# Patient Record
Sex: Male | Born: 1970 | Race: White | Hispanic: No | Marital: Married | Smoking: Former smoker
Health system: Southern US, Community
[De-identification: ages and names within clinical notes are randomized; demographics above are authoritative.]

---

## 2007-03-16 ENCOUNTER — Ambulatory Visit: Payer: Self-pay | Admitting: Family Medicine

## 2007-08-13 ENCOUNTER — Encounter: Payer: Self-pay | Admitting: Sports Medicine

## 2007-08-31 ENCOUNTER — Encounter: Payer: Self-pay | Admitting: Sports Medicine

## 2019-02-27 ENCOUNTER — Ambulatory Visit: Payer: Self-pay

## 2019-02-27 ENCOUNTER — Other Ambulatory Visit: Payer: Self-pay

## 2019-02-27 DIAGNOSIS — Z23 Encounter for immunization: Secondary | ICD-10-CM

## 2019-04-09 ENCOUNTER — Other Ambulatory Visit: Payer: Self-pay

## 2019-04-09 DIAGNOSIS — Z20822 Contact with and (suspected) exposure to covid-19: Secondary | ICD-10-CM

## 2019-04-10 LAB — NOVEL CORONAVIRUS, NAA: SARS-CoV-2, NAA: DETECTED — AB

## 2020-02-23 ENCOUNTER — Emergency Department: Payer: BC Managed Care – PPO

## 2020-02-23 ENCOUNTER — Emergency Department
Admission: EM | Admit: 2020-02-23 | Discharge: 2020-02-23 | Disposition: A | Discharge status: Home / Self Care | Payer: BC Managed Care – PPO | Attending: Emergency Medicine | Admitting: Emergency Medicine

## 2020-02-23 ENCOUNTER — Other Ambulatory Visit: Payer: Self-pay

## 2020-02-23 ENCOUNTER — Encounter: Payer: Self-pay | Admitting: Emergency Medicine

## 2020-02-23 DIAGNOSIS — R079 Chest pain, unspecified: Secondary | ICD-10-CM | POA: Diagnosis not present

## 2020-02-23 DIAGNOSIS — R0789 Other chest pain: Secondary | ICD-10-CM

## 2020-02-23 LAB — CBC
HCT: 46 % (ref 39.0–52.0)
Hemoglobin: 15.7 g/dL (ref 13.0–17.0)
MCH: 32.4 pg (ref 26.0–34.0)
MCHC: 34.1 g/dL (ref 30.0–36.0)
MCV: 95 fL (ref 80.0–100.0)
Platelets: 233 10*3/uL (ref 150–400)
RBC: 4.84 MIL/uL (ref 4.22–5.81)
RDW: 12.6 % (ref 11.5–15.5)
WBC: 6.8 10*3/uL (ref 4.0–10.5)
nRBC: 0 % (ref 0.0–0.2)

## 2020-02-23 LAB — TROPONIN I (HIGH SENSITIVITY)
Troponin I (High Sensitivity): 6 ng/L (ref <18)
Troponin I (High Sensitivity): 10 ng/L (ref <18)

## 2020-02-23 LAB — BASIC METABOLIC PANEL
Anion gap: 9 (ref 5–15)
BUN: 13 mg/dL (ref 6–20)
CO2: 29 mmol/L (ref 22–32)
Calcium: 9.4 mg/dL (ref 8.9–10.3)
Chloride: 103 mmol/L (ref 98–111)
Creatinine, Ser: 0.96 mg/dL (ref 0.61–1.24)
GFR, Estimated: >60 mL/min (ref >60)
Glucose, Bld: 113 mg/dL — ABNORMAL HIGH (ref 70–99)
Potassium: 4 mmol/L (ref 3.5–5.1)
Sodium: 141 mmol/L (ref 135–145)

## 2020-02-23 MED ORDER — ASPIRIN 81 MG PO CHEW
324.0000 mg | CHEWABLE_TABLET | Freq: Once | ORAL | Status: AC
  Administered 2020-02-23: 324 mg via ORAL
  Filled 2020-02-23: qty 4

## 2020-02-23 MED ORDER — IOHEXOL 350 MG/ML SOLN
100.0000 mL | Freq: Once | INTRAVENOUS | Status: AC | PRN
  Administered 2020-02-23: 100 mL via INTRAVENOUS

## 2020-02-23 NOTE — ED Notes (Signed)
Patient transported to CT 

## 2020-02-23 NOTE — Discharge Instructions (Addendum)
Please avoid exertion as we discussed, return to the ED if any change or worsening

## 2020-02-23 NOTE — ED Triage Notes (Signed)
Pt reports this am around 7:30 awoke with pressure like pain to his left shoulder hat radiates down into his chest. Pt denies a cardiac hx but reports over the last week has had similar pain with running. Pt reports some SOB as well.

## 2020-02-23 NOTE — ED Provider Notes (Signed)
[facility] Emergency Department Provider Note   ____________________________________________    I have reviewed the triage vital signs and the nursing notes.   HISTORY  Chief Complaint Chest pain   HPI Francisco Schwartz is a 49 y.o. male who presents with complaints of chest pain. Patient describes chest discomfort in his left anterior superior chest with some radiation to his shoulder on the left as well. He reports this occurred 1 week ago when he first started on a jog but improved after the first half mile. Patient is an avid runner. No cough or fevers. Has had COVID-19 vaccinations. Is a very strong family history of heart disease, his father had an angioplasty at age 21 and his grandfather had a heart attack in his 29s as well  History reviewed. No pertinent past medical history.  There are no problems to display for this patient.   History reviewed. No pertinent surgical history.  Prior to Admission medications   Not on File     Allergies Patient has no allergy information on record.  No family history on file.  Social History No drugs, no smoking  Review of Systems  Constitutional: No fever/chills Eyes: No visual changes.  ENT: No sore throat. Cardiovascular: As above Respiratory: Denies shortness of breath. Gastrointestinal: No abdominal pain.   Genitourinary: Negative for dysuria. Musculoskeletal: Negative for back pain. Skin: Negative for rash. Neurological: Negative for headaches    ____________________________________________   PHYSICAL EXAM:  VITAL SIGNS: ED Triage Vitals  Enc Vitals Group     BP 02/23/20 0935 133/77     Pulse Rate 02/23/20 0935 63     Resp 02/23/20 0935 17     Temp 02/23/20 0935 98 F (36.7 C)     Temp Source 02/23/20 0935 Oral     SpO2 02/23/20 0935 100 %     Weight 02/23/20 0925 72.6 kg (160 lb)     Height 02/23/20 0925 1.727 m (5\' 8" )     Head Circumference --      Peak Flow --      Pain  Score 02/23/20 0924 7     Pain Loc --      Pain Edu? --      Excl. in GC? --     Constitutional: Alert and oriented. No acute distress. Pleasant and interactive  Nose: No congestion/rhinnorhea. Mouth/Throat: Mucous membranes are moist.   Neck:  Painless ROM Cardiovascular: Normal rate, regular rhythm. Grossly normal heart sounds.  Good peripheral circulation. Respiratory: Normal respiratory effort.  No retractions. Lungs CTAB.  Genitourinary: deferred Musculoskeletal: No lower extremity tenderness nor edema.  Warm and well perfused Neurologic:  Normal speech and language. No gross focal neurologic deficits are appreciated.  Skin:  Skin is warm, dry and intact. No rash noted. Psychiatric: Mood and affect are normal. Speech and behavior are normal.  ____________________________________________   LABS (all labs ordered are listed, but only abnormal results are displayed)  Labs Reviewed  BASIC METABOLIC PANEL - Abnormal; Notable for the following components:      Result Value   Glucose, Bld 113 (*)    All other components within normal limits  CBC  TROPONIN I (HIGH SENSITIVITY)  TROPONIN I (HIGH SENSITIVITY)   ____________________________________________  EKG  ED ECG REPORT I, 02/25/20, the attending physician, personally viewed and interpreted this ECG.  Date: 02/23/2020  Rhythm: normal sinus rhythm QRS Axis: normal Intervals: Normal ST/T Wave abnormalities: normal Narrative Interpretation: no evidence of acute  ischemia  ____________________________________________  RADIOLOGY  Chest x-ray reviewed by me, no infiltrate or pneumothorax CT angiography ____________________________________________   PROCEDURES  Procedure(s) performed: No  Procedures   Critical Care performed: No ____________________________________________   INITIAL IMPRESSION / ASSESSMENT AND PLAN / ED COURSE  Pertinent labs & imaging results that were available during my care of  the patient were reviewed by me and considered in my medical decision making (see chart for details).  Patient presents with pain in the anterior chest rating to the left shoulder, does seem to have an exertional component. Today he woke up with it this morning, this is quite atypical for him.   Differential includes angina/ACS, PE, dissection, musculoskeletal pain.  No calf pain or swelling, no pleurisy to suggest PE.  EKG is overall reassuring, initial troponin is normal, will do delta troponin. Will obtain CT angiography to evaluate for dissection given radiation to the left shoulder.  CT angiography reviewed by me, no abnormalities noted, radiology has confirmed  Delta troponin is reassuring, heart score of 2, patient is low risk, asymptomatic at this time, appropriate for discharge.  Counseled no exercise physical exertion until cleared by cardiology, close follow-up needed.  Dr. Gwen Pounds reports his office will call the patient tomorrow for close follow-up    ____________________________________________   FINAL CLINICAL IMPRESSION(S) / ED DIAGNOSES  Final diagnoses:  Atypical chest pain        Note:  This document was prepared using Dragon voice recognition software and may include unintentional dictation errors.   Jene Every, MD 02/23/20 1248

## 2020-05-15 ENCOUNTER — Other Ambulatory Visit (HOSPITAL_COMMUNITY): Payer: Self-pay | Admitting: Rehabilitative and Restorative Service Providers"

## 2020-05-15 ENCOUNTER — Other Ambulatory Visit: Payer: Self-pay | Admitting: Cardiology

## 2020-05-15 DIAGNOSIS — Z8249 Family history of ischemic heart disease and other diseases of the circulatory system: Secondary | ICD-10-CM

## 2020-05-15 DIAGNOSIS — R0789 Other chest pain: Secondary | ICD-10-CM

## 2020-05-15 DIAGNOSIS — R9439 Abnormal result of other cardiovascular function study: Secondary | ICD-10-CM

## 2020-05-19 ENCOUNTER — Telehealth (HOSPITAL_COMMUNITY): Payer: Self-pay | Admitting: *Deleted

## 2020-05-19 NOTE — Telephone Encounter (Signed)
Reaching out to patient to offer assistance regarding upcoming cardiac imaging study; pt verbalizes understanding of appt date/time, parking situation and where to check in, pre-test NPO status, and verified current allergies; name and call back number provided for further questions should they arise  Larey Brick RN Navigator Cardiac Imaging [facility] [phone] office [phone] cell  Pt reports resting HR is around 60bpm or lower.

## 2020-05-21 ENCOUNTER — Other Ambulatory Visit: Payer: Self-pay

## 2020-05-21 ENCOUNTER — Ambulatory Visit
Admission: RE | Admit: 2020-05-21 | Discharge: 2020-05-21 | Disposition: A | Discharge status: Home / Self Care | Payer: BC Managed Care – PPO | Source: Ambulatory Visit | Attending: Rehabilitative and Restorative Service Providers" | Admitting: Rehabilitative and Restorative Service Providers"

## 2020-05-21 DIAGNOSIS — R0789 Other chest pain: Secondary | ICD-10-CM

## 2020-05-21 DIAGNOSIS — Z8249 Family history of ischemic heart disease and other diseases of the circulatory system: Secondary | ICD-10-CM | POA: Diagnosis not present

## 2020-05-21 DIAGNOSIS — R9439 Abnormal result of other cardiovascular function study: Secondary | ICD-10-CM | POA: Insufficient documentation

## 2020-05-21 MED ORDER — NITROGLYCERIN 0.4 MG SL SUBL
0.8000 mg | SUBLINGUAL_TABLET | Freq: Once | SUBLINGUAL | Status: AC
  Administered 2020-05-21: 0.8 mg via SUBLINGUAL

## 2020-05-21 MED ORDER — METOPROLOL TARTRATE 5 MG/5ML IV SOLN
10.0000 mg | Freq: Once | INTRAVENOUS | Status: AC
  Administered 2020-05-21: 10 mg via INTRAVENOUS

## 2020-05-21 MED ORDER — IOHEXOL 350 MG/ML SOLN
75.0000 mL | Freq: Once | INTRAVENOUS | Status: AC | PRN
  Administered 2020-05-21: 75 mL via INTRAVENOUS

## 2020-05-21 NOTE — Progress Notes (Signed)
Patient tolerated procedure well. Ambulate w/o difficulty. Sitting in chair drinking water provided. Encouraged to drink extra water today and reasoning explained. Verbalized understanding. All questions answered. ABC intact. No further needs. Discharge from procedure area w/o issues.  

## 2022-09-12 IMAGING — CT CT HEART MORP W/ CTA COR W/ SCORE W/ CA W/CM &/OR W/O CM
2 of 11 series · 7 of 20 positions shown, 9 images · non-contrast
Comparison: None.

Addendum:
CLINICAL DATA: Chest pain

EXAM:
Cardiac/Coronary  CTA
TECHNIQUE: The patient was scanned on a Siemens Somatoform go.Top scanner.

[Series 26: multiphase % cta coronary 0.60 · axial · 0.34mm/px · z∈[-1122,-1079]mm · 2 of 2261 slices shown]
[im 754/2261  vessel]
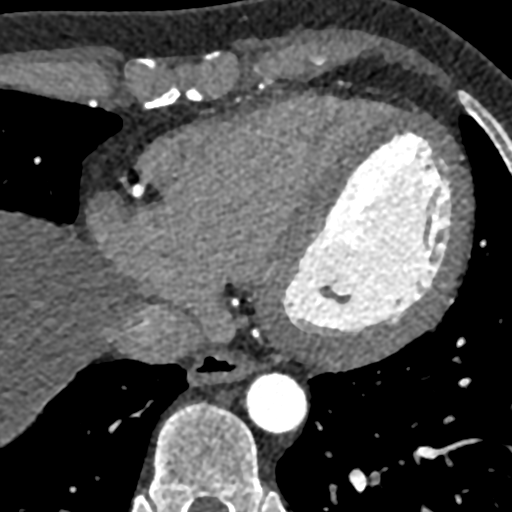
[im 1507/2261  vessel]
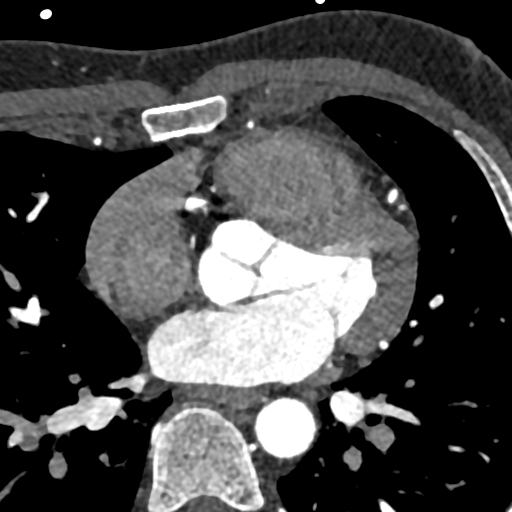

[Series 44: ms multiphase cta coronary 0.60 · axial · 0.34mm/px · z∈[-1143,-1057]mm · 5 of 2907 slices shown, 7 images]
[im 485/2907  vessel]
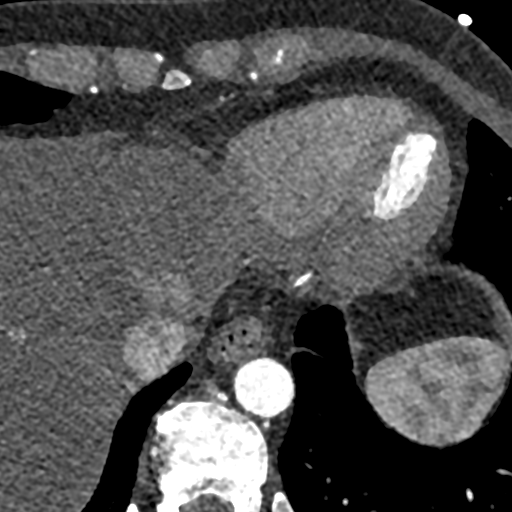
[im 485/2907  lung]
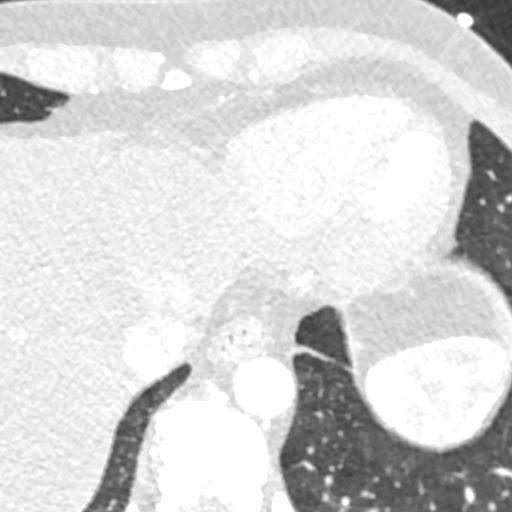
[im 969/2907  vessel]
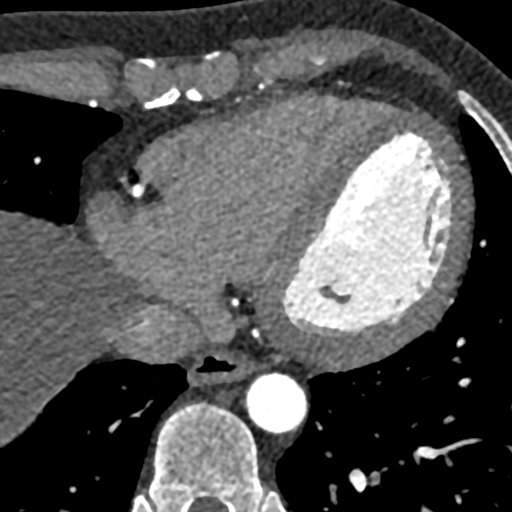
[im 1454/2907  vessel]
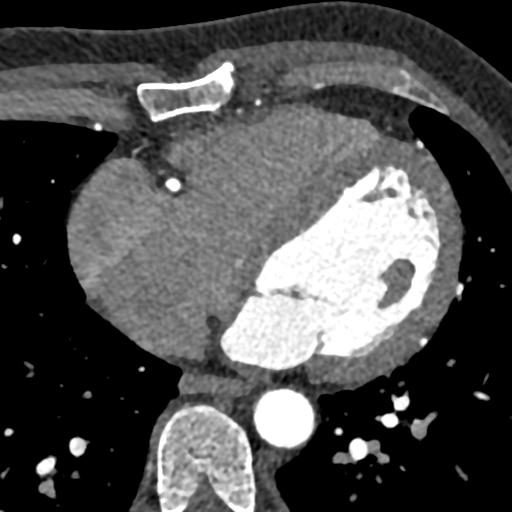
[im 1938/2907  vessel]
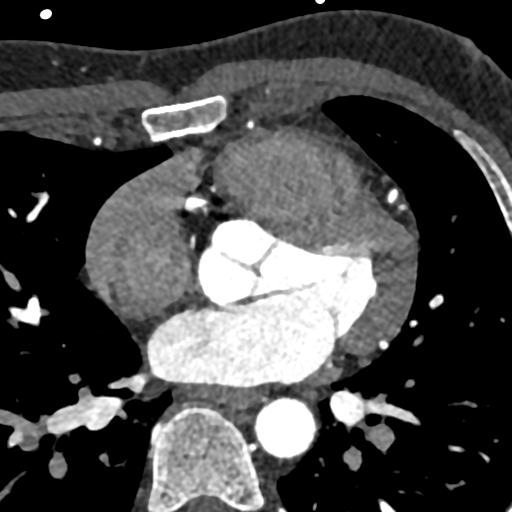
[im 2422/2907  vessel]
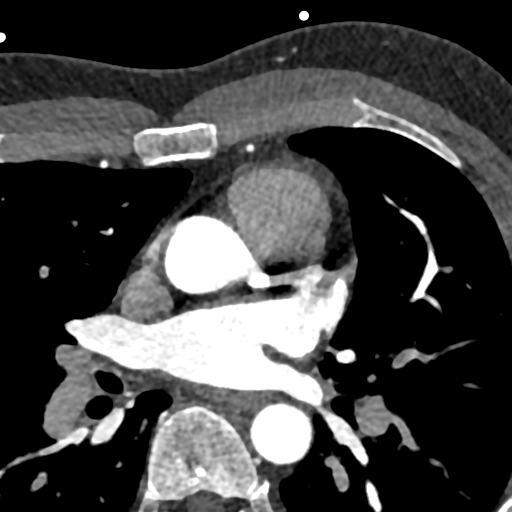
[im 2422/2907  lung]
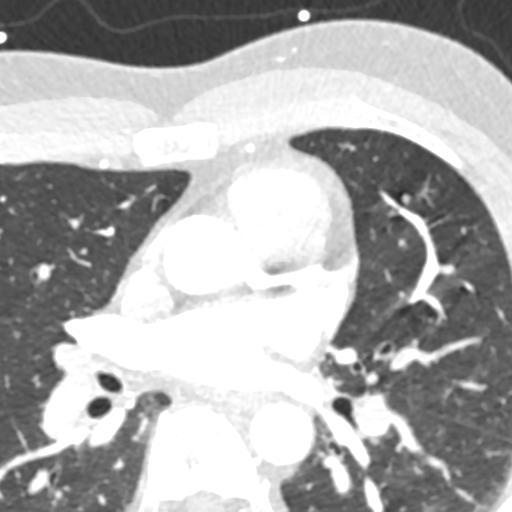

[7 of 20 positions shown; findings below may reference images not displayed]

FINDINGS: A retrospective scan was triggered in the descending thoracic aorta.
Axial non-contrast 3 mm slices were carried out through the heart.
The data set was analyzed on a dedicated work station and scored
using the Agatson method. Gantry rotation speed was 330 msecs and
collimation was .6 mm. 100mg of metoprolol and 0.8 mg of sl NTG was
given. The 3D data set was reconstructed in 5% intervals of the
60-95 % of the R-R cycle. Diastolic phases were analyzed on a
dedicated work station using MPR, MIP and VRT modes. The patient
received 75 cc of contrast.

Aorta:  Normal size.  No calcifications.  No dissection.

Aortic Valve:  Trileaflet.  No calcifications.

Coronary Arteries:  Normal coronary origin.  Right dominance.

RCA is a large dominant artery that gives rise to PDA and PLA. There
is no plaque.

Left main is a large artery that gives rise to LAD and LCX arteries.

LAD has no plaque.

LCX is a non-dominant artery that gives rise to one large OM1
branch. There is no plaque.

Other findings:

Normal pulmonary vein drainage into the left atrium.

Normal left atrial appendage without a thrombus.

Normal size of the pulmonary artery.
IMPRESSION: 1. Coronary calcium score of 0. Patient is low risk for coronary
events

2. Normal coronary origin with right dominance.

3. No evidence of CAD.

4. CAD-RADS 0. Consider non-atherosclerotic causes of chest pain.

EXAM:
OVER-READ INTERPRETATION  CT CHEST

The following report is an over-read performed by radiologist Dr.
over-read does not include interpretation of cardiac or coronary
anatomy or pathology. The coronary calcium score and cardiac CTA
interpretation by the cardiologist is attached.
FINDINGS: Within the visualized portions of the thorax there are no suspicious
appearing pulmonary nodules or masses, there is no acute
consolidative airspace disease, no pleural effusions, no
pneumothorax and no lymphadenopathy. Visualized portions of the
upper abdomen are unremarkable. There are no aggressive appearing
lytic or blastic lesions noted in the visualized portions of the
skeleton.
IMPRESSION: No significant incidental noncardiac findings are noted.

*** End of Addendum ***
FINDINGS: A retrospective scan was triggered in the descending thoracic aorta.
Axial non-contrast 3 mm slices were carried out through the heart.
The data set was analyzed on a dedicated work station and scored
using the Agatson method. Gantry rotation speed was 330 msecs and
collimation was .6 mm. 100mg of metoprolol and 0.8 mg of sl NTG was
given. The 3D data set was reconstructed in 5% intervals of the
60-95 % of the R-R cycle. Diastolic phases were analyzed on a
dedicated work station using MPR, MIP and VRT modes. The patient
received 75 cc of contrast.

Aorta:  Normal size.  No calcifications.  No dissection.

Aortic Valve:  Trileaflet.  No calcifications.

Coronary Arteries:  Normal coronary origin.  Right dominance.

RCA is a large dominant artery that gives rise to PDA and PLA. There
is no plaque.

Left main is a large artery that gives rise to LAD and LCX arteries.

LAD has no plaque.

LCX is a non-dominant artery that gives rise to one large OM1
branch. There is no plaque.

Other findings:

Normal pulmonary vein drainage into the left atrium.

Normal left atrial appendage without a thrombus.

Normal size of the pulmonary artery.
IMPRESSION: 1. Coronary calcium score of 0. Patient is low risk for coronary
events

2. Normal coronary origin with right dominance.

3. No evidence of CAD.

4. CAD-RADS 0. Consider non-atherosclerotic causes of chest pain.

## 2022-11-29 ENCOUNTER — Emergency Department
Admission: EM | Admit: 2022-11-29 | Discharge: 2022-11-29 | Disposition: A | Discharge status: Home / Self Care | Payer: BC Managed Care – PPO | Attending: Emergency Medicine | Admitting: Emergency Medicine

## 2022-11-29 ENCOUNTER — Other Ambulatory Visit: Payer: Self-pay

## 2022-11-29 ENCOUNTER — Encounter: Payer: Self-pay | Admitting: Emergency Medicine

## 2022-11-29 DIAGNOSIS — S81852A Open bite, left lower leg, initial encounter: Secondary | ICD-10-CM | POA: Insufficient documentation

## 2022-11-29 DIAGNOSIS — Y9302 Activity, running: Secondary | ICD-10-CM | POA: Diagnosis not present

## 2022-11-29 DIAGNOSIS — W540XXA Bitten by dog, initial encounter: Secondary | ICD-10-CM | POA: Diagnosis not present

## 2022-11-29 DIAGNOSIS — Z23 Encounter for immunization: Secondary | ICD-10-CM | POA: Diagnosis not present

## 2022-11-29 MED ORDER — TETANUS-DIPHTH-ACELL PERTUSSIS 5-2.5-18.5 LF-MCG/0.5 IM SUSY
0.5000 mL | PREFILLED_SYRINGE | Freq: Once | INTRAMUSCULAR | Status: AC
  Administered 2022-11-29: 0.5 mL via INTRAMUSCULAR
  Filled 2022-11-29: qty 0.5

## 2022-11-29 MED ORDER — RABIES VACCINE, PCEC IM SUSR: 1.0000 mL | Freq: Once | INTRAMUSCULAR | Status: DC

## 2022-11-29 MED ORDER — RABIES IMMUNE GLOBULIN 150 UNIT/ML IM INJ
20.0000 [IU]/kg | INJECTION | Freq: Once | INTRAMUSCULAR | Status: DC
  Filled 2022-11-29: qty 10

## 2022-11-29 MED ORDER — AMOXICILLIN-POT CLAVULANATE 875-125 MG PO TABS: 1.0000 | ORAL_TABLET | Freq: Two times a day (BID) | ORAL | 0 refills | Status: AC

## 2022-11-29 MED ORDER — LIDOCAINE HCL (PF) 1 % IJ SOLN
5.0000 mL | Freq: Once | INTRAMUSCULAR | Status: AC
  Administered 2022-11-29: 5 mL

## 2022-11-29 MED ORDER — IBUPROFEN 600 MG PO TABS
600.0000 mg | ORAL_TABLET | Freq: Once | ORAL | Status: AC
  Administered 2022-11-29: 600 mg via ORAL
  Filled 2022-11-29: qty 1

## 2022-11-29 NOTE — Discharge Instructions (Addendum)
You were seen in the emergency department for a dog bite.  Animal control was notified.  Your wound was repaired, follow-up in 7 days to have your sutures removed.  Keep the area dry and clean.  You can apply triple antibiotic ointment to the area twice a day.  Start your antibiotics as prescribed.  Return to the emergency department for any signs of an infection.  Your tetanus was updated today.  Since the dog has been restrained and can be observed, can observe for 10 days and if abnormal behavior, complete rabies series.  You can stay in touch with animal control.  Pain control:  Ibuprofen (motrin/aleve/advil) - You can take 3 tablets (600 mg) every 6 hours as needed for pain/fever.  Acetaminophen (tylenol) - You can take 2 extra strength tablets (1000 mg) every 6 hours as needed for pain/fever.  You can alternate these medications or take them together.  Make sure you eat food/drink water when taking these medications.

## 2022-11-29 NOTE — ED Triage Notes (Signed)
Pt bit by dog today while running. Unsure if dog is vaccinated. Wound to left calf.

## 2022-11-29 NOTE — ED Provider Notes (Signed)
[facility] Provider Note    Event Date/Time   First MD Initiated Contact with Patient 11/29/22 1333     (approximate)   History   Animal Bite   HPI  Francisco Schwartz is a 52 y.o. male no significant past medical history presents to the emergency department for dog bite.  Patient was on a run around [place] when he was attacked by dog.  States that the neighbors were there who owned the dog.  States that they have the dog and is being contained.  They stated that the rabies vaccinations are up-to-date however he is uncertain because there is multiple dogs there.  Uncertain of last tetanus shot.  Took Tylenol prior to arrival.     Physical Exam   Triage Vital Signs: ED Triage Vitals  Encounter Vitals Group     BP 11/29/22 1333 (!) 138/95     Systolic BP Percentile --      Diastolic BP Percentile --      Pulse Rate 11/29/22 1333 (!) 106     Resp 11/29/22 1333 16     Temp 11/29/22 1333 98 F (36.7 C)     Temp Source 11/29/22 1333 Oral     SpO2 11/29/22 1333 100 %     Weight 11/29/22 1332 160 lb (72.6 kg)     Height 11/29/22 1332 5\' 8"  (1.727 m)     Head Circumference --      Peak Flow --      Pain Score 11/29/22 1333 6     Pain Loc --      Pain Education --      Exclude from Growth Chart --     Most recent vital signs: Vitals:   11/29/22 1333  BP: (!) 138/95  Pulse: (!) 106  Resp: 16  Temp: 98 F (36.7 C)  SpO2: 100%    Physical Exam Constitutional:      Appearance: He is well-developed.  HENT:     Head: Atraumatic.  Eyes:     Conjunctiva/sclera: Conjunctivae normal.  Cardiovascular:     Rate and Rhythm: Regular rhythm.  Pulmonary:     Effort: No respiratory distress.  Musculoskeletal:     Cervical back: Normal range of motion.     Comments: Laceration/dog bite to the left lower leg, hemostatic  Skin:    General: Skin is warm.  Neurological:     Mental Status: He is alert. Mental status is at baseline.      IMPRESSION / MDM  / ASSESSMENT AND PLAN / ED COURSE  I reviewed the triage vital signs and the nursing notes.  Animal control was notified.  Differential diagnosis including rabies prophylaxis, dog bite, laceration care   Labs (all labs ordered are listed, but only abnormal results are displayed) Labs interpreted as -    Labs Reviewed - No data to display    Tetanus updated.  Given Motrin for team pain control.  After shared decision making opted to close the patient's wound.  Will start the patient on antibiotics.  Discussed Motrin and Tylenol for pain control.  Animal control able to note confirmed that the dog's vaccinations for rabies were up-to-date.  They will quarantine the dog for 10 days.  Patient stated that he will follow-up and return if any concern with animal control to complete rabies vaccinations.   PROCEDURES:  Critical Care performed: No  ..Laceration Repair  Date/Time: 11/29/2022 3:21 PM  Performed by: Corena Herter, MD Authorized  by: Corena Herter, MD   Consent:    Consent obtained:  Verbal   Consent given by:  Patient   Risks, benefits, and alternatives were discussed: yes     Risks discussed:  Pain, nerve damage, poor wound healing, poor cosmetic result, vascular damage, tendon damage, infection and need for additional repair   Alternatives discussed:  Delayed treatment and no treatment Universal protocol:    Procedure explained and questions answered to patient or proxy's satisfaction: yes     Relevant documents present and verified: yes     Test results available: yes     Imaging studies available: yes     Required blood products, implants, devices, and special equipment available: yes     Patient identity confirmed:  Verbally with patient Anesthesia:    Anesthesia method:  Local infiltration   Local anesthetic:  Lidocaine 1% w/o epi Laceration details:    Location:  Leg   Leg location:  L lower leg   Length (cm):  3 Pre-procedure details:    Preparation:   Patient was prepped and draped in usual sterile fashion Treatment:    Area cleansed with:  Povidone-iodine and saline   Amount of cleaning:  Standard   Irrigation solution:  Sterile saline   Irrigation volume:  500 Skin repair:    Repair method:  Sutures   Suture size:  4-0   Suture material:  Prolene   Suture technique:  Simple interrupted   Number of sutures:  6 Approximation:    Approximation:  Loose Repair type:    Repair type:  Intermediate Post-procedure details:    Dressing:  Sterile dressing   Procedure completion:  Tolerated well, no immediate complications   Patient's presentation is most consistent with acute illness / injury with system symptoms.   MEDICATIONS ORDERED IN ED: Medications  Tdap (BOOSTRIX) injection 0.5 mL (0.5 mLs Intramuscular Given 11/29/22 1413)  ibuprofen (ADVIL) tablet 600 mg (600 mg Oral Given 11/29/22 1412)  lidocaine (PF) (XYLOCAINE) 1 % injection 5 mL (5 mLs Other Given by Other 11/29/22 1434)    FINAL CLINICAL IMPRESSION(S) / ED DIAGNOSES   Final diagnoses:  Dog bite, initial encounter     Rx / DC Orders   ED Discharge Orders          Ordered    amoxicillin-clavulanate (AUGMENTIN) 875-125 MG tablet  2 times daily        11/29/22 1515             Note:  This document was prepared using Dragon voice recognition software and may include unintentional dictation errors.   Corena Herter, MD 11/29/22 [phone]

## 2023-02-23 ENCOUNTER — Ambulatory Visit: Payer: BC Managed Care – PPO

## 2023-02-23 DIAGNOSIS — Z1211 Encounter for screening for malignant neoplasm of colon: Secondary | ICD-10-CM

## 2023-02-23 DIAGNOSIS — K640 First degree hemorrhoids: Secondary | ICD-10-CM

## 2023-02-23 DIAGNOSIS — D122 Benign neoplasm of ascending colon: Secondary | ICD-10-CM
